# Patient Record
Sex: Female | Born: 2000 | Race: Black or African American | Hispanic: No | Marital: Single | Smoking: Never smoker
Health system: Southern US, Community
[De-identification: ages and names within clinical notes are randomized; demographics above are authoritative.]

## PROBLEM LIST (undated history)

## (undated) DIAGNOSIS — S129XXA Fracture of neck, unspecified, initial encounter: Secondary | ICD-10-CM

## (undated) DIAGNOSIS — S329XXA Fracture of unspecified parts of lumbosacral spine and pelvis, initial encounter for closed fracture: Secondary | ICD-10-CM

## (undated) DIAGNOSIS — S42009A Fracture of unspecified part of unspecified clavicle, initial encounter for closed fracture: Secondary | ICD-10-CM

## (undated) DIAGNOSIS — S0990XA Unspecified injury of head, initial encounter: Secondary | ICD-10-CM

## (undated) DIAGNOSIS — S069XAA Unspecified intracranial injury with loss of consciousness status unknown, initial encounter: Secondary | ICD-10-CM

## (undated) DIAGNOSIS — S7290XA Unspecified fracture of unspecified femur, initial encounter for closed fracture: Secondary | ICD-10-CM

## (undated) DIAGNOSIS — S069X9A Unspecified intracranial injury with loss of consciousness of unspecified duration, initial encounter: Secondary | ICD-10-CM

## (undated) HISTORY — PX: CARDIAC SURGERY: SHX584

## (undated) HISTORY — PX: OTHER SURGICAL HISTORY: SHX169

## (undated) HISTORY — PX: PELVIC FRACTURE SURGERY: SHX119

## (undated) HISTORY — PX: NECK SURGERY: SHX720

---
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
## 2021-06-04 ENCOUNTER — Ambulatory Visit (HOSPITAL_COMMUNITY)
Admission: EM | Admit: 2021-06-04 | Discharge: 2021-06-04 | Disposition: A | Discharge status: Home / Self Care | Payer: Medicaid Other | Attending: Licensed Clinical Social Worker | Admitting: Licensed Clinical Social Worker

## 2021-06-04 DIAGNOSIS — F331 Major depressive disorder, recurrent, moderate: Secondary | ICD-10-CM | POA: Insufficient documentation
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
## 2021-06-04 NOTE — Discharge Instructions (Addendum)
Safety Plan ?Ladan Hromadka will reach out to her current psychiatric provider,call 911 or call mobile crisis, or go to nearest emergency room if condition worsens or if suicidal thoughts become active ?Patients' will follow up with her current provider for outpatient psychiatric services (therapy/medication management).  ?The suicide prevention education provided includes the following: ?Suicide risk factors ?Suicide prevention and interventions ?National Suicide Hotline telephone number ?[facility] assessment telephone number ?[facility] Emergency Assistance 911 ?[place] and/or Residential Mobile Crisis Unit telephone number ?Request made of family/significant other to:   ?Remove weapons (e.g., guns, rifles, knives), all items previously/currently identified as safety concern.   ?Remove drugs/medications (over the counter, prescriptions, illicit drugs), all items previously/currently identified as a safety concern.   ?
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
## 2021-06-04 NOTE — ED Notes (Signed)
Pt discharged with  AVS.  AVS reviewed prior to discharge.  Pt alert, oriented, and ambulatory.  Safety maintained.  °
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
## 2021-08-20 ENCOUNTER — Encounter: Payer: Self-pay | Admitting: Physical Medicine & Rehabilitation

## 2021-11-28 ENCOUNTER — Encounter: Payer: Medicaid Other | Attending: Physical Medicine & Rehabilitation | Admitting: Physical Medicine & Rehabilitation

## 2021-11-28 ENCOUNTER — Encounter: Payer: Self-pay | Admitting: Physical Medicine & Rehabilitation

## 2021-11-28 VITALS — BP 115/67 | HR 86 | Ht 63.0 in | Wt 269.0 lb

## 2021-11-28 DIAGNOSIS — G44321 Chronic post-traumatic headache, intractable: Secondary | ICD-10-CM | POA: Diagnosis not present

## 2021-11-28 DIAGNOSIS — F068 Other specified mental disorders due to known physiological condition: Secondary | ICD-10-CM | POA: Insufficient documentation

## 2021-11-28 DIAGNOSIS — S069X0S Unspecified intracranial injury without loss of consciousness, sequela: Secondary | ICD-10-CM | POA: Diagnosis not present

## 2021-11-28 DIAGNOSIS — R4689 Other symptoms and signs involving appearance and behavior: Secondary | ICD-10-CM | POA: Diagnosis not present

## 2021-11-28 MED ORDER — TOPIRAMATE 25 MG PO TABS: 25.0000 mg | ORAL_TABLET | Freq: Every day | ORAL | 2 refills | Status: DC

## 2021-11-28 NOTE — Progress Notes (Signed)
Subjective:    Patient ID: Candice Robles, female    DOB: March 01, 2000, 21 y.o.   MRN: [id number]  HPI  [name] is here for an iniital evaluation of her TBI. She was injured in 2016 and what appears to be a severe traumatic brain injury ,and was treated at [facility] over the course of 4 months.. She received outpt therapies as well. There was follow up initially by the rehab physcian as well as psychiatry.  Long-term she has been followed by psychiatry including here at [facility] behavioral health.  Appears that she has been on numerous medications for her cognition as well as behavior.  She has been on stimulants in the past for her concentration deficits.  She is currently on guanfacine for attention.  Patient remains reliant on her mother for organization of her day, taking meds etc.  She is able to dress herself but sometimes needs some cueing.  The patient reports that she is agitated easily by every day things. She can get physically abusive and verbally as well. She sometimes is able to walk away from the situation but often things become too inflamed and she is unable to redirect herself or pull herself away.  She is on Vraylar as well as propranolol for mood stabilization currently.  She feels depressed when she "thinks about things'. She feels sad that her friends are going to college. She sees a therapist who helps her deal with the above emotions. She uses music to help calm her through advice of her therapist. She is on prozac 40mg  and propranolol 60 daily  She feels that her appetite has increased. She doesn't have the drive to exercise.   Bedtime is around 7pm and she ends up waking up around 2 since her brother moved out 2 weeks ago. Her sleep is broken after that, usually minimal. Sleep was a problem before her brother moved out. She takes 200mg  trazodone, prazosin, clonidine, guanfacine, hydroxyzine, gabapentin all at bedtime. She used melatonin and nyquil.  She does not  feel that this combination of sleep medications is working for her.  She is having daily which vary in location. They are constant. They seem to be worse in the morning when she gets out of bed. They will also be worse if she misses her mid day nap.      Pain Inventory Average Pain 6 Pain Right Now 3 My pain is intermittent, constant, sharp, burning, dull, stabbing, tingling, and aching  LOCATION OF PAIN  both shoulder, back, headache, right ankle  BOWEL Number of stools per week: 2  BLADDER Normal   Mobility walk without assistance ability to climb steps?  yes do you drive?  no Do you have any goals in this area?  yes  Function disabled: date disabled 2016 I need assistance with the following:  meal prep, household duties, and shopping Do you have any goals in this area?  yes  Neuro/Psych weakness numbness tingling spasms dizziness confusion depression anxiety  Prior Studies Any changes since last visit?  no  Physicians involved in your care Any changes since last visit?  no   Family History  Problem Relation Age of Onset   Diabetes Other    Social History   Socioeconomic History   Marital status: Single    Spouse name: Not on file   Number of children: Not on file   Years of education: Not on file   Highest education level: Not on file  Occupational History  Not on file  Tobacco Use   Smoking status: Never   Smokeless tobacco: Never  Vaping Use   Vaping Use: Never used  Substance and Sexual Activity   Alcohol use: No   Drug use: No   Sexual activity: Not on file  Other Topics Concern   Not on file  Social History Narrative   Not on file   Social Determinants of Health   Financial Resource Strain: Not on file  Food Insecurity: Not on file  Transportation Needs: Not on file  Physical Activity: Not on file  Stress: Not on file  Social Connections: Not on file   Past Surgical History:  Procedure Laterality Date   CARDIAC SURGERY      ear reattachment surgery     facial skin grafts     gastric feeding tube     NECK SURGERY     PELVIC FRACTURE SURGERY     Past Medical History:  Diagnosis Date   Broken neck ([place])    Clavicle fracture    Femur fracture ([place])    Head injury    Pelvic fracture ([facility])    TBI (traumatic brain injury) ([place])    BP 115/67   Pulse 86   Ht 5\' 3"  (1.6 m)   Wt 269 lb (122 kg)   SpO2 96%   BMI 47.65 kg/m   Opioid Risk Score:   Fall Risk Score:  `1  Depression screen [facility] 2/9     06/04/2021    5:59 PM  Depression screen PHQ 2/9  Decreased Interest   Down, Depressed, Hopeless   PHQ - 2 Score   Altered sleeping   Tired, decreased energy   Change in appetite   Feeling bad or failure about yourself    Trouble concentrating   Moving slowly or fidgety/restless   Suicidal thoughts   PHQ-9 Score   Difficult doing work/chores      Information is confidential and restricted. Go to Review Flowsheets to unlock data.    Review of Systems  Constitutional:        Weight gain  Neurological:  Positive for dizziness, weakness and numbness.  Psychiatric/Behavioral:  Positive for confusion.        Depression, anxiety  All other systems reviewed and are negative.      Objective:   Physical Exam  Gen: no distress, normal appearing. obese HEENT: oral mucosa pink and moist, NCAT Cardio: Reg rate Chest: normal effort, normal rate of breathing Abd: soft, non-distended Ext: no edema Psych: pleasant, normal affect.  No signs of aggression or irritability are present today during this visit. Skin: intact.  Some scarring along the right side of the face and scalp. Neuro: Alert and oriented x 3. Normal insight and awareness. Intact Memory. Normal language and speech. Cranial nerve exam unremarkable except for decreased hearing on the right.  She also struggled with vision at distance.  Spelled world forward and backwards. Did some simple algebra. Couldn't do serial 7's. Abstract fair.   Patient recalled 2 out of 3 objects after 5 minutes.  Was able to remember some recent news items as well.  Interestingly she often started a response with "I do not know or I do not remember". Musculoskeletal: subjective left ankle pain. Didn't appear to be overly favoring the limb during gait.        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 Post-traumatic headaches.     Plan: Adjust sleep medication regimen -dc guanfacine, gabapentin, hydroxyzine, clonidine -Continue trazodone and prazosin.  -Add in melatonin 10mg  qhs -Needs to adjust sleep time.  7:00 is too early.  Asked her to target 8:30 PM.  Would like her to decrease her napping during the day as well.  If she needs a short catnap that is okay.  (Less than 30 minutes) otherwise if she goes longer she is affecting her regular sleep at night. 2. Trial of topamax 25mg  qhs, increase to 50mg  in 5 days. 3.  Asked the patient to start taking on more responsibility at home starting with her medications.  It seems as if she has developed a sense of learned helplessness.  I think she is capable of doing much more than she is showing currently.  There may be a maturity factor with this as well. 4.  We will make some other adjustments to her regimen as we go along but will start with her headaches and sleep today and move from there.   Over an hour of face to face patient care time were spent during this visit. All questions were encouraged and answered. Follow up with me in a month or so.

## 2021-11-28 NOTE — Patient Instructions (Addendum)
ALWAYS FEEL FREE TO CALL OUR OFFICE WITH ANY PROBLEMS OR QUESTIONS [phone])  **PLEASE NOTE** ALL MEDICATION REFILL REQUESTS (INCLUDING CONTROLLED SUBSTANCES) NEED TO BE MADE AT LEAST 7 DAYS PRIOR TO REFILL BEING DUE. ANY REFILL REQUESTS INSIDE THAT TIME FRAME MAY RESULT IN DELAYS IN RECEIVING YOUR PRESCRIPTION.   TAKE TOPAMAX 25MG  AT NIGHT FOR 4 DAYS THEN INCREASE TO 50MG     MELATONIN 10MG  AT BEDTIME WITH TRAZODONE, PRAZOSIN, AND TOPAMAX.    STOP GUANFACINE, HYDROXYZINE, GABAPENTIN, CLONIDINE   TRY TO GO TO BED AROUND 8:30.  LIMIT YOUR DAY TIME NAPS ALSO.

## 2022-01-07 ENCOUNTER — Telehealth: Payer: Self-pay

## 2022-01-07 NOTE — Telephone Encounter (Signed)
Horatio Pel Psychiatric Therapist called  to discuss Candice Robles care. She also wanted to know if she will seeing her for the TBI?  Call back phone [phone].

## 2022-02-06 ENCOUNTER — Encounter: Payer: Self-pay | Admitting: Physical Medicine & Rehabilitation

## 2022-02-06 ENCOUNTER — Encounter: Payer: Medicaid Other | Attending: Physical Medicine & Rehabilitation | Admitting: Physical Medicine & Rehabilitation

## 2022-02-06 VITALS — BP 132/84 | HR 85 | Ht 63.0 in | Wt 265.0 lb

## 2022-02-06 DIAGNOSIS — R4689 Other symptoms and signs involving appearance and behavior: Secondary | ICD-10-CM | POA: Diagnosis present

## 2022-02-06 DIAGNOSIS — F4310 Post-traumatic stress disorder, unspecified: Secondary | ICD-10-CM | POA: Diagnosis present

## 2022-02-06 DIAGNOSIS — S069X0S Unspecified intracranial injury without loss of consciousness, sequela: Secondary | ICD-10-CM | POA: Diagnosis present

## 2022-02-06 DIAGNOSIS — F068 Other specified mental disorders due to known physiological condition: Secondary | ICD-10-CM | POA: Diagnosis present

## 2022-02-06 MED ORDER — AZSTARYS 26.1-5.2 MG PO CAPS: 1.0000 | ORAL_CAPSULE | Freq: Every morning | ORAL | 0 refills | Status: DC

## 2022-02-06 MED ORDER — TOPIRAMATE 50 MG PO TABS: 50.0000 mg | ORAL_TABLET | Freq: Two times a day (BID) | ORAL | 2 refills | Status: DC

## 2022-02-06 MED ORDER — PRAZOSIN HCL 5 MG PO CAPS: 5.0000 mg | ORAL_CAPSULE | Freq: Every day | ORAL | 4 refills | Status: DC

## 2022-02-06 NOTE — Progress Notes (Signed)
Subjective:    Patient ID: Candice Robles, female    DOB: January 27, 2000, 22 y.o.   MRN: [id number]  HPI  Candice Robles is here in follow up of her TBI and associated deficits. I last saw her a month ago for her initial evaluation.   She seems to think that she has headaches for about 30 minutes after she eats. Overall she reports improvement. She is going to bed later. Often she is sleeping until 0500 although she will occasionally awaken at 0300.   Mom reports that she still doesn't have much initiation. She remains on azstarys. Mom thinks she needs something more. Candice Robles is not taking responsibility for meds and doesn't initiate much acctivity on her own.    Pain Inventory Average Pain 6 Pain Right Now 7 My pain is aching  LOCATION OF PAIN  head  BOWEL Number of stools per week: 2 Oral laxative use No  Type of laxative . Enema or suppository use No  History of colostomy No  Incontinent No   BLADDER Normal In and out cath, frequency . Able to self cath  . Bladder incontinence No  Frequent urination No  Leakage with coughing No  Difficulty starting stream No  Incomplete bladder emptying No    Mobility how many minutes can you walk? 30 ability to climb steps?  yes do you drive?  no  Function disabled: date disabled 04/25/2014 I need assistance with the following:  toileting, meal prep, and household duties  Neuro/Psych numbness loss of taste or smell  Prior Studies Any changes since last visit?  no  Physicians involved in your care Any changes since last visit?  no   Family History  Problem Relation Age of Onset   Diabetes Other    Social History   Socioeconomic History   Marital status: Single    Spouse name: Not on file   Number of children: Not on file   Years of education: Not on file   Highest education level: Not on file  Occupational History   Not on file  Tobacco Use   Smoking status: Never   Smokeless tobacco: Never  Vaping Use   Vaping  Use: Never used  Substance and Sexual Activity   Alcohol use: No   Drug use: No   Sexual activity: Not on file  Other Topics Concern   Not on file  Social History Narrative   Not on file   Social Determinants of Health   Financial Resource Strain: Not on file  Food Insecurity: Not on file  Transportation Needs: Not on file  Physical Activity: Not on file  Stress: Not on file  Social Connections: Not on file   Past Surgical History:  Procedure Laterality Date   CARDIAC SURGERY     ear reattachment surgery     facial skin grafts     gastric feeding tube     NECK SURGERY     PELVIC FRACTURE SURGERY     Past Medical History:  Diagnosis Date   Broken neck ([place])    Clavicle fracture    Femur fracture ([place])    Head injury    Pelvic fracture ([facility])    TBI (traumatic brain injury) ([place])    BP 132/84   Pulse 85   Ht 5\' 3"  (1.6 m)   Wt 265 lb (120.2 kg)   SpO2 98%   BMI 46.94 kg/m   Opioid Risk Score:   Fall Risk Score:  `1  Depression screen PHQ  2/9     06/04/2021    5:59 PM  Depression screen PHQ 2/9  Decreased Interest   Down, Depressed, Hopeless   PHQ - 2 Score   Altered sleeping   Tired, decreased energy   Change in appetite   Feeling bad or failure about yourself    Trouble concentrating   Moving slowly or fidgety/restless   Suicidal thoughts   PHQ-9 Score   Difficult doing work/chores      Information is confidential and restricted. Go to Review Flowsheets to unlock data.      Review of Systems  Neurological:  Positive for numbness and headaches.  All other systems reviewed and are negative.     Objective:   Physical Exam  General: No acute distress HEENT: NCAT, EOMI, oral membranes moist Cards: reg rate  Chest: normal effort Abdomen: Soft, NT, ND Skin: dry, intact Extremities: no edema Psych: pleasant and appropriate  Skin: intact.  Some scarring along the right side of the face and scalp. Neuro: Alert and oriented x 3. Normal insight  and awareness. Intact Memory. Normal language and speech. Cranial nerve exam unremarkable except for decreased hearing on the right. attention and memory issues still present Musculoskeletal: moves all 4's. Didn't appear to be overly favoring the limb during gait.           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 Post-traumatic headaches.         Plan: Adjusting sleep medication regimen -off guanfacine, gabapentin, hydroxyzine, clonidine -Continue trazodone -increase prazosin to 6mg  for nightmares - melatonin 10mg  qhs -continue later bedtime. 8:30pm --push back to 9 or 9:30pm if possible 2. Increase topamax to 50mg  bid. Headaches are improving 3. We discussed again taking on more responsibility at home starting with her medications.  It seems as if she has developed a sense of learned helplessness.  I think she is capable of doing much more than she is showing currently.  ? maturity factor. 4. Mood seems positive. Continue prozac daily 5. Refilled Azstarys--look at other options for initiation   -calendar, schedule to help with memory, cognition as a whole     Over 30 min of face to face patient care time were spent during this visit. All questions were encouraged and answered. Follow up with me in 2 mos

## 2022-02-06 NOTE — Patient Instructions (Signed)
ALWAYS FEEL FREE TO CALL OUR OFFICE WITH ANY PROBLEMS OR QUESTIONS ([phone])  **PLEASE NOTE** ALL MEDICATION REFILL REQUESTS (INCLUDING CONTROLLED SUBSTANCES) NEED TO BE MADE AT LEAST 7 DAYS PRIOR TO REFILL BEING DUE. ANY REFILL REQUESTS INSIDE THAT TIME FRAME MAY RESULT IN DELAYS IN RECEIVING YOUR PRESCRIPTION.                    

## 2022-02-13 IMAGING — DX DG ANKLE COMPLETE 3+V*L*
3 series · 3 of 3 positions shown · non-contrast
Comparison: None.

CLINICAL DATA: Twisted ankle yesterday.

EXAM:
LEFT ANKLE COMPLETE - 3+ VIEW

[ankle ap]
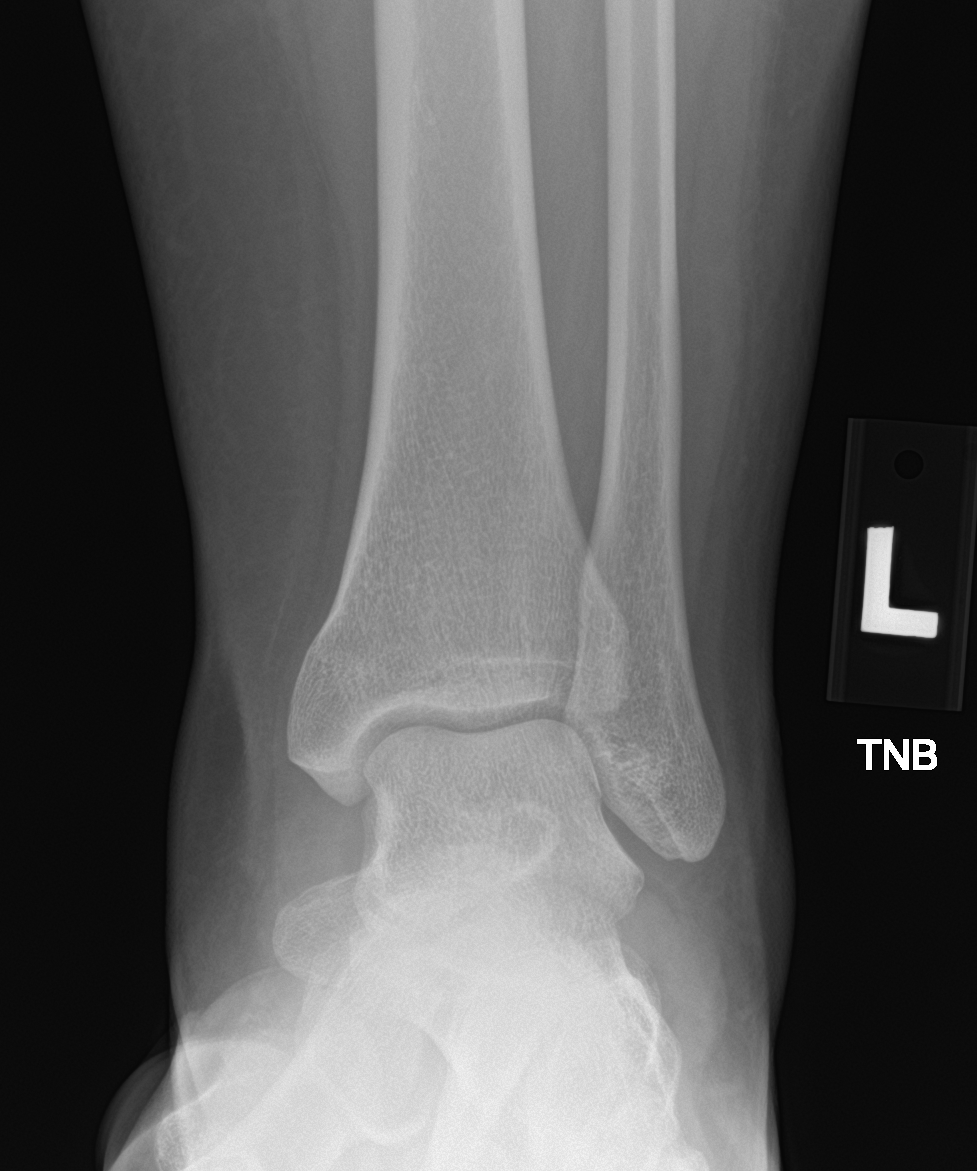

[ankle obl]
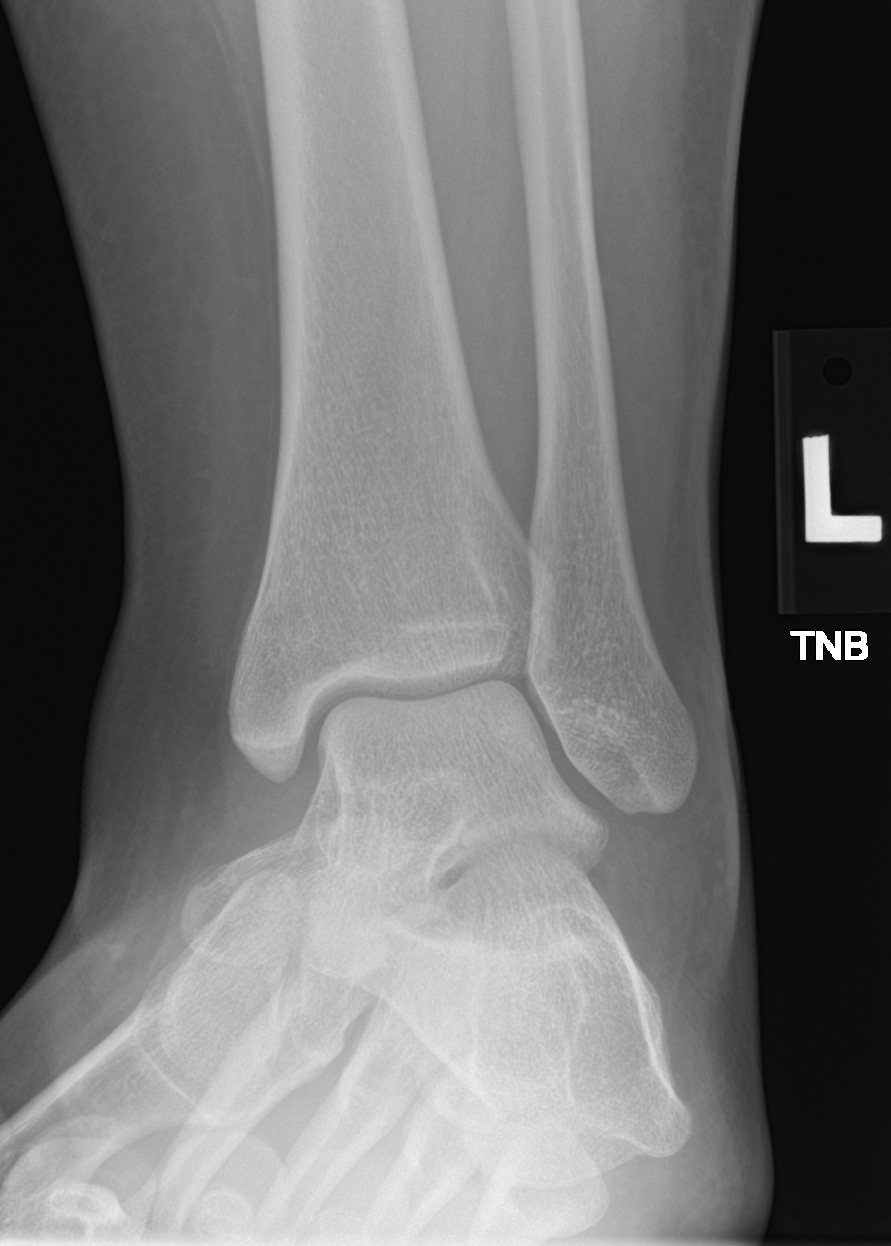

[ankle lat]
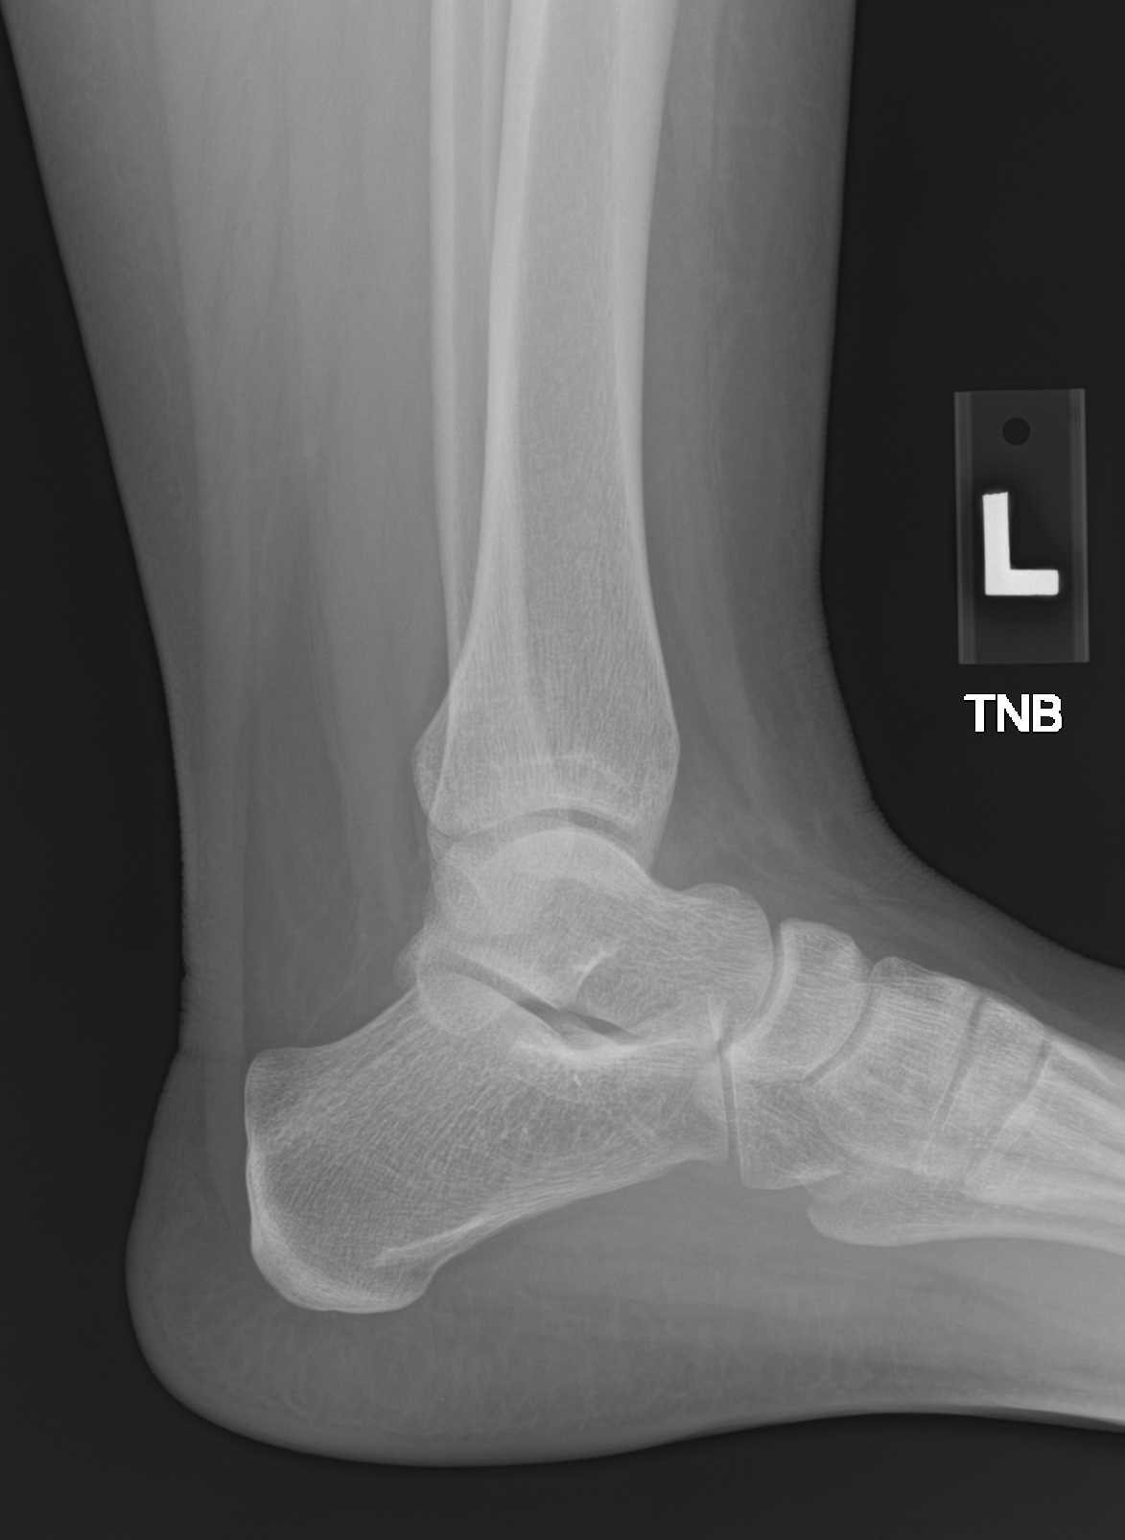

[3 of 3 positions shown; findings below may reference images not displayed]

FINDINGS: There is no evidence of fracture, dislocation, or joint effusion.
There is no evidence of arthropathy or other focal bone abnormality.
Soft tissues are unremarkable.
IMPRESSION: Negative.

## 2022-03-19 ENCOUNTER — Telehealth: Payer: Self-pay | Admitting: *Deleted

## 2022-03-19 DIAGNOSIS — R4689 Other symptoms and signs involving appearance and behavior: Secondary | ICD-10-CM

## 2022-03-19 DIAGNOSIS — F068 Other specified mental disorders due to known physiological condition: Secondary | ICD-10-CM

## 2022-03-19 MED ORDER — AZSTARYS 26.1-5.2 MG PO CAPS: 1.0000 | ORAL_CAPSULE | Freq: Every morning | ORAL | 0 refills | Status: DC

## 2022-03-19 NOTE — Telephone Encounter (Signed)
Patient requesting refill: last ov 02/06/22  AZSTARYS 26.1-5.2 MG CAPS [id number]   Order Details  Dose: 1 capsule Route: Oral Frequency: Every morning  Dispense Quantity: 30 capsule Refills: 0        Sig: Take 1 capsule by mouth every morning.       Start Date: 02/06/22 End Date: --  Written Date: 02/06/22 Expiration Date: 08/05/22  Earliest Fill Date: 02/06/22

## 2022-03-19 NOTE — Telephone Encounter (Signed)
PMP was Reviewed.  Azstarys refilled today.

## 2022-04-03 ENCOUNTER — Encounter: Payer: Self-pay | Admitting: Physical Medicine & Rehabilitation

## 2022-04-03 ENCOUNTER — Encounter: Payer: Medicaid Other | Attending: Physical Medicine & Rehabilitation | Admitting: Physical Medicine & Rehabilitation

## 2022-04-03 VITALS — BP 124/79 | HR 82 | Ht 63.0 in | Wt 263.4 lb

## 2022-04-03 DIAGNOSIS — F068 Other specified mental disorders due to known physiological condition: Secondary | ICD-10-CM | POA: Diagnosis not present

## 2022-04-03 DIAGNOSIS — S069X0S Unspecified intracranial injury without loss of consciousness, sequela: Secondary | ICD-10-CM | POA: Diagnosis not present

## 2022-04-03 DIAGNOSIS — R4689 Other symptoms and signs involving appearance and behavior: Secondary | ICD-10-CM | POA: Insufficient documentation

## 2022-04-03 MED ORDER — AZSTARYS 39.2-7.8 MG PO CAPS: 1.0000 | ORAL_CAPSULE | ORAL | 0 refills | Status: DC

## 2022-04-03 NOTE — Progress Notes (Signed)
Subjective:    Patient ID: Candice Robles, female    DOB: 01-May-2000, 22 y.o.   MRN: [id number]  HPI  Candice Robles is here in follow up of her TBI. She has been walking on the treadmill and going to the gym. She does sit ups and light weight lifting. She has had some abdominal pain perhaps from the sit ups. She is moving bowels regularly. She has occasional neck pain from her prior surgery.   Her headaches have improved with topamax increase. She does use ibuprofen every 3 days or so. She has a mild headache every day.   I asked Neliah to take on some more responsibility at home. She has been more responsible for her meds. Her dad has helped her. Mom says she still doesn't initiate much on her own. She relies still on mom for a lot. Mom works 3rd shift, and she will wake her up for questions frequently while she's asleep.   Mood overall is better. Sleep is more consistent.   Pain Inventory Average Pain 6 Pain Right Now 0 My pain is aching  LOCATION OF PAIN  abdominal  BOWEL Number of stools per week: 6 Oral laxative use No  Type of laxative . Enema or suppository use No  History of colostomy No  Incontinent No   BLADDER Normal In and out cath, frequency . Able to self cath  . Bladder incontinence No  Frequent urination No  Leakage with coughing No  Difficulty starting stream No  Incomplete bladder emptying No    Mobility walk without assistance  Function disabled: date disabled 04/25/2014 I need assistance with the following:  meal prep, household duties, and shopping  Neuro/Psych spasms dizziness depression anxiety loss of taste or smell  Prior Studies Any changes since last visit?  no  Physicians involved in your care Any changes since last visit?  no   Family History  Problem Relation Age of Onset   Diabetes Other    Social History   Socioeconomic History   Marital status: Single    Spouse name: Not on file   Number of children: Not on file    Years of education: Not on file   Highest education level: Not on file  Occupational History   Not on file  Tobacco Use   Smoking status: Never   Smokeless tobacco: Never  Vaping Use   Vaping Use: Never used  Substance and Sexual Activity   Alcohol use: No   Drug use: No   Sexual activity: Not on file  Other Topics Concern   Not on file  Social History Narrative   Not on file   Social Determinants of Health   Financial Resource Strain: Not on file  Food Insecurity: Not on file  Transportation Needs: Not on file  Physical Activity: Not on file  Stress: Not on file  Social Connections: Not on file   Past Surgical History:  Procedure Laterality Date   CARDIAC SURGERY     ear reattachment surgery     facial skin grafts     gastric feeding tube     NECK SURGERY     PELVIC FRACTURE SURGERY     Past Medical History:  Diagnosis Date   Broken neck ([place])    Clavicle fracture    Femur fracture ([place])    Head injury    Pelvic fracture ([facility])    TBI (traumatic brain injury) ([place])    BP 124/79   Pulse 82   Ht  $'5\' 3"'t$  (1.6 m)   Wt 263 lb 6.4 oz (119.5 kg)   SpO2 98%   BMI 46.66 kg/m   Opioid Risk Score:   Fall Risk Score:  `1  Depression screen [facility] 2/9     06/04/2021    5:59 PM  Depression screen PHQ 2/9  Decreased Interest   Down, Depressed, Hopeless   PHQ - 2 Score   Altered sleeping   Tired, decreased energy   Change in appetite   Feeling bad or failure about yourself    Trouble concentrating   Moving slowly or fidgety/restless   Suicidal thoughts   PHQ-9 Score   Difficult doing work/chores      Information is confidential and restricted. Go to Review Flowsheets to unlock data.    Family History  Problem Relation Age of Onset   Diabetes Other    Social History   Socioeconomic History   Marital status: Single    Spouse name: Not on file   Number of children: Not on file   Years of education: Not on file   Highest education level: Not on file   Occupational History   Not on file  Tobacco Use   Smoking status: Never   Smokeless tobacco: Never  Vaping Use   Vaping Use: Never used  Substance and Sexual Activity   Alcohol use: No   Drug use: No   Sexual activity: Not on file  Other Topics Concern   Not on file  Social History Narrative   Not on file   Social Determinants of Health   Financial Resource Strain: Not on file  Food Insecurity: Not on file  Transportation Needs: Not on file  Physical Activity: Not on file  Stress: Not on file  Social Connections: Not on file   Past Surgical History:  Procedure Laterality Date   CARDIAC SURGERY     ear reattachment surgery     facial skin grafts     gastric feeding tube     NECK SURGERY     PELVIC FRACTURE SURGERY     Past Medical History:  Diagnosis Date   Broken neck ([place])    Clavicle fracture    Femur fracture ([place])    Head injury    Pelvic fracture ([facility])    TBI (traumatic brain injury) ([place])    BP 124/79   Pulse 82   Ht '5\' 3"'$  (1.6 m)   Wt 263 lb 6.4 oz (119.5 kg)   SpO2 98%   BMI 46.66 kg/m   Opioid Risk Score:   Fall Risk Score:  `1  Depression screen [facility] 2/9     06/04/2021    5:59 PM  Depression screen PHQ 2/9  Decreased Interest   Down, Depressed, Hopeless   PHQ - 2 Score   Altered sleeping   Tired, decreased energy   Change in appetite   Feeling bad or failure about yourself    Trouble concentrating   Moving slowly or fidgety/restless   Suicidal thoughts   PHQ-9 Score   Difficult doing work/chores      Information is confidential and restricted. Go to Review Flowsheets to unlock data.     Review of Systems  Respiratory:  Positive for shortness of breath.   Gastrointestinal:  Positive for constipation.  Neurological:  Positive for dizziness.  Psychiatric/Behavioral:  Positive for dysphoric mood. The patient is nervous/anxious.   All other systems reviewed and are negative.     Objective:   Physical Exam  General:  No acute  distress HEENT: NCAT, EOMI, oral membranes moist Cards: reg rate  Chest: normal effort Abdomen: Soft, NT, ND Skin: dry, intact Extremities: no edema Psych: pleasant and appropriate  Skin: intact.  Some scarring along the right side of the face and scalp. Neuro: Alert and oriented x 3. Normal insight and awareness. Short term memory and awareness issues. Attention a problem also.  Normal language and speech. Cranial nerve exam unremarkable except for decreased hearing on the right. attention and memory issues still present Musculoskeletal: moves all 4's.           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 Post-traumatic headaches.         Plan: Adjusting sleep medication regimen -off guanfacine, gabapentin, hydroxyzine, clonidine -Continue trazodone -continue prazosin  '6mg'$  for nightmares--she's sleeping better - melatonin '10mg'$  qhs -continue later bedtime.  9 or 9:30pm if possible 2. maintain topamax at '50mg'$  bid. Headaches are improving 3. We discussed AGAIN taking on more responsibility at home starting with her medications.  It seems as if she has developed a sense of learned helplessness.  A routine and schedule are key. She will use her phone alarms more consistently to help with meds 4. Mood seems positive. Continue prozac daily 5. incease Azstarys to 39 mg                       -consider conerta trial   -mom will call back with progress/status   Over 20 min of face to face patient care time were spent during this visit. All questions were encouraged and answered. Follow up with me in 3 mos

## 2022-04-03 NOTE — Patient Instructions (Addendum)
ALWAYS FEEL FREE TO CALL OUR OFFICE WITH ANY PROBLEMS OR QUESTIONS [phone])  **PLEASE NOTE** ALL MEDICATION REFILL REQUESTS (INCLUDING CONTROLLED SUBSTANCES) NEED TO BE MADE AT LEAST 7 DAYS PRIOR TO REFILL BEING DUE. ANY REFILL REQUESTS INSIDE THAT TIME FRAME MAY RESULT IN DELAYS IN RECEIVING YOUR PRESCRIPTION.    USE YOUR PHONE ALARM FOR YOUR MEDS! DON'T TURN OFF THE ALARM UNTIL THE MEDICATION IS IN YOUR BELL!!!!  USE [employer] THROUGHOUT THE HOUSE AS REMINDERS FOR ROUTINE, CHORES, MEDS, ETC. KEEPING A DAILY ROUTINE AND SCHEDULE IS KEY TO BUILDING HER MEMORY.

## 2022-04-05 ENCOUNTER — Telehealth: Payer: Self-pay

## 2022-04-05 NOTE — Telephone Encounter (Signed)
Faxed to medicaid on 04/05/22

## 2022-04-22 NOTE — Telephone Encounter (Signed)
PA has been re-submitted with additional information after speaking with [place] Medicaid Rep call ID# [phone]

## 2022-04-22 NOTE — Telephone Encounter (Signed)
PA for Azstarys has been denied per patient's mother. Patient's mother inquiring plan/ or if medication is going to be switched to something else?

## 2022-04-24 NOTE — Telephone Encounter (Signed)
PA was approved with additional information for Astarys 39.2 mg caps.

## 2022-05-21 ENCOUNTER — Other Ambulatory Visit: Payer: Self-pay | Admitting: *Deleted

## 2022-05-21 DIAGNOSIS — R4689 Other symptoms and signs involving appearance and behavior: Secondary | ICD-10-CM

## 2022-05-21 DIAGNOSIS — F068 Other specified mental disorders due to known physiological condition: Secondary | ICD-10-CM

## 2022-05-21 MED ORDER — TOPIRAMATE 50 MG PO TABS: 50.0000 mg | ORAL_TABLET | Freq: Two times a day (BID) | ORAL | 2 refills | Status: DC

## 2022-05-21 MED ORDER — AZSTARYS 39.2-7.8 MG PO CAPS: 1.0000 | ORAL_CAPSULE | ORAL | 0 refills | Status: DC

## 2022-05-30 ENCOUNTER — Other Ambulatory Visit: Payer: Self-pay | Admitting: Physical Medicine & Rehabilitation

## 2022-05-30 DIAGNOSIS — F4310 Post-traumatic stress disorder, unspecified: Secondary | ICD-10-CM

## 2022-05-30 DIAGNOSIS — R4689 Other symptoms and signs involving appearance and behavior: Secondary | ICD-10-CM

## 2022-05-30 DIAGNOSIS — F068 Other specified mental disorders due to known physiological condition: Secondary | ICD-10-CM

## 2022-06-03 ENCOUNTER — Other Ambulatory Visit: Payer: Self-pay | Admitting: *Deleted

## 2022-06-03 DIAGNOSIS — R4689 Other symptoms and signs involving appearance and behavior: Secondary | ICD-10-CM

## 2022-06-03 DIAGNOSIS — F4310 Post-traumatic stress disorder, unspecified: Secondary | ICD-10-CM

## 2022-06-03 DIAGNOSIS — S069XAS Other symptoms and signs involving appearance and behavior: Secondary | ICD-10-CM

## 2022-06-03 DIAGNOSIS — F068 Other specified mental disorders due to known physiological condition: Secondary | ICD-10-CM

## 2022-06-03 DIAGNOSIS — R4189 Other symptoms and signs involving cognitive functions and awareness: Secondary | ICD-10-CM

## 2022-06-06 NOTE — Telephone Encounter (Signed)
Refill submitted. 

## 2022-06-26 ENCOUNTER — Other Ambulatory Visit: Payer: Self-pay | Admitting: *Deleted

## 2022-06-26 DIAGNOSIS — R4689 Other symptoms and signs involving appearance and behavior: Secondary | ICD-10-CM

## 2022-06-26 DIAGNOSIS — F068 Other specified mental disorders due to known physiological condition: Secondary | ICD-10-CM

## 2022-06-26 MED ORDER — AZSTARYS 39.2-7.8 MG PO CAPS: 1.0000 | ORAL_CAPSULE | ORAL | 0 refills | Status: DC

## 2022-06-26 MED ORDER — VRAYLAR 4.5 MG PO CAPS: ORAL_CAPSULE | ORAL | 5 refills | Status: AC

## 2022-07-03 ENCOUNTER — Encounter: Payer: Self-pay | Admitting: Physical Medicine & Rehabilitation

## 2022-07-03 ENCOUNTER — Encounter: Payer: Medicaid Other | Attending: Physical Medicine & Rehabilitation | Admitting: Physical Medicine & Rehabilitation

## 2022-07-03 VITALS — BP 113/75 | HR 91 | Ht 63.0 in | Wt 258.0 lb

## 2022-07-03 DIAGNOSIS — S069X0S Unspecified intracranial injury without loss of consciousness, sequela: Secondary | ICD-10-CM | POA: Diagnosis present

## 2022-07-03 DIAGNOSIS — R4689 Other symptoms and signs involving appearance and behavior: Secondary | ICD-10-CM | POA: Insufficient documentation

## 2022-07-03 DIAGNOSIS — F068 Other specified mental disorders due to known physiological condition: Secondary | ICD-10-CM | POA: Insufficient documentation

## 2022-07-03 NOTE — Patient Instructions (Addendum)
IMPROVE YOUR SLEEP HYGIENE!  GOING TO BED IN THE EVENING AT A CONSISTENT TIME  TURN OFF NOISES, LIGHTS, TV.   PUT ON RELAXING SOUNDS, MUSIC. READ A BOOK!   LIMIT YOUR NAPS TO 1 HOUR PER DAY----USE AN ALARM  KEEP A ROUTINE AND SCHEDULE

## 2022-07-03 NOTE — Progress Notes (Signed)
Subjective:    Patient ID: Candice Robles, female    DOB: 2000/06/02, 22 y.o.   MRN: [id number]  HPI  Candice Robles is here in follow up of her TBI. She has been volunteering handing out food, picking up trash.   We talked about her sleep patterns which are out of kilter once again.  I asked her how much she is napping during the day.  She told me she is taking about 6 hours of naps per day.  She is up a lot throughout the night.  The matter what she takes from medication standpoint it is not helping.  Regardless of whether she is taking trazodone or melatonin or even Tylenol PM, nothing seems to help.  As result she claims that she feels tired a lot during the day.  I also asked about her medications and it sounds as if she still not taking regular responsibility for them.  Mom says that when she is with her dad she will often come home with most of her medications on taken.  Otherwise she does not have a lot of complaints.  She does note some occasional left ankle swelling which can be tender.  She is wearing crocs today but states that she does have some tennis shoes that she wears.  Mom notes that her gait can be a bit disjointed.   Pain Inventory Average Pain 0 Pain Right Now 8 My pain is intermittent  LOCATION OF PAIN  stomach, legs, arms & shoulder  BOWEL Number of stools per week: 7 Oral laxative use No    BLADDER Normal    Mobility walk without assistance ability to climb steps?  yes do you drive?  no Do you have any goals in this area?  yes  Function disabled: date disabled 04/2014 I need assistance with the following:  meal prep, household duties, and shopping Do you have any goals in this area?  yes  Neuro/Psych bowel control problems weakness numbness spasms dizziness confusion depression anxiety loss of taste or smell  Prior Studies Any changes since last visit?  no  Physicians involved in your care Any changes since last visit?  no   Family  History  Problem Relation Age of Onset   Diabetes Other    Social History   Socioeconomic History   Marital status: Single    Spouse name: Not on file   Number of children: Not on file   Years of education: Not on file   Highest education level: Not on file  Occupational History   Not on file  Tobacco Use   Smoking status: Never   Smokeless tobacco: Never  Vaping Use   Vaping Use: Never used  Substance and Sexual Activity   Alcohol use: No   Drug use: No   Sexual activity: Not on file  Other Topics Concern   Not on file  Social History Narrative   Not on file   Social Determinants of Health   Financial Resource Strain: Not on file  Food Insecurity: Not on file  Transportation Needs: Not on file  Physical Activity: Not on file  Stress: Not on file  Social Connections: Not on file   Past Surgical History:  Procedure Laterality Date   CARDIAC SURGERY     ear reattachment surgery     facial skin grafts     gastric feeding tube     NECK SURGERY     PELVIC FRACTURE SURGERY     Past Medical History:  Diagnosis Date   Broken neck (HCC)    Clavicle fracture    Femur fracture (HCC)    Head injury    Pelvic fracture (HCC)    TBI (traumatic brain injury) (HCC)    BP 113/75   Pulse 91   Ht 5\' 3"  (1.6 m)   Wt 258 lb (117 kg)   SpO2 98%   BMI 45.70 kg/m   Opioid Risk Score:   Fall Risk Score:  `1  Depression screen [facility] 2/9     07/03/2022    9:23 AM 06/04/2021    5:59 PM  Depression screen PHQ 2/9  Decreased Interest 1   Down, Depressed, Hopeless 1   PHQ - 2 Score 2   Altered sleeping    Tired, decreased energy    Change in appetite    Feeling bad or failure about yourself     Trouble concentrating    Moving slowly or fidgety/restless    Suicidal thoughts    PHQ-9 Score    Difficult doing work/chores       Information is confidential and restricted. Go to Review Flowsheets to unlock data.    Review of Systems  Constitutional:        Loss of  taste  Cardiovascular:  Negative for leg swelling.       Swelling in both ankles  Gastrointestinal:  Positive for diarrhea.  Musculoskeletal:        Pain in both shoulder, pain in both legs & ankles  Neurological:  Positive for dizziness and headaches.       Spasms  Psychiatric/Behavioral:  Positive for confusion.        Depression, anxiety  All other systems reviewed and are negative.      Objective:   Physical Exam  General: No acute distress HEENT: NCAT, EOMI, oral membranes moist Cards: reg rate  Chest: normal effort Abdomen: Soft, NT, ND Skin: dry, intact Extremities: tr LE edema Psych: pleasant and appropriate  Skin: intact.  Some scarring along the right side of the face and scalp. Neuro: Alert and oriented x 3. Normal insight and awareness. Short term memory and awareness issues. Attention a problem also.  Normal language and speech. Cranial nerve exam unremarkable except for decreased hearing on the right. attention and memory isses ongoing Musculoskeletal: steps down into left leg. Still with decreased coordination.           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 Post-traumatic headaches.         Plan: Adjusting sleep medication regimen -off guanfacine, gabapentin, hydroxyzine, clonidine -Continue trazodone -continue prazosin  6mg  for nightmares--  - melatonin 10mg  qhs -needs to establish consistent nightly sleep! Without this her cognitive issues/faitgue will continue to be issues. None of the above meds will help if she's not restoring her normal cycle. -may take an hour nap during the day. Can use an alarm to wake her.  2. maintain topamax at 50mg  bid. Headaches are improving 3. Discussed being reponsible for meds 4. Mood seems positive. Continue prozac daily. I'm happy that she's doing some volunteer type activities.   5. Continue Azstarys to 39 mg                       -consider conerta trial              -stiimulants won't help  if she's napping throughout the day and awake a tnights   15 min of face to face patient care time were spent during this visit. All questions were encouraged and answered. Follow up with me in 2 mos

## 2022-07-16 ENCOUNTER — Other Ambulatory Visit: Payer: Self-pay | Admitting: Physical Medicine and Rehabilitation

## 2022-07-16 ENCOUNTER — Telehealth: Payer: Self-pay

## 2022-07-16 MED ORDER — PROPRANOLOL HCL 60 MG PO TABS: 60.0000 mg | ORAL_TABLET | Freq: Every day | ORAL | 3 refills | Status: AC

## 2022-07-16 NOTE — Telephone Encounter (Signed)
Patient's mother notified.

## 2022-07-16 NOTE — Telephone Encounter (Signed)
Pt mother called requesting a refill on Propranolol. I did not see it in Dr. Riley Kill last note. Can you address this in his absence? [facility]

## 2022-07-29 ENCOUNTER — Telehealth: Payer: Self-pay | Admitting: Physical Medicine & Rehabilitation

## 2022-07-29 DIAGNOSIS — R4689 Other symptoms and signs involving appearance and behavior: Secondary | ICD-10-CM

## 2022-07-29 DIAGNOSIS — F068 Other specified mental disorders due to known physiological condition: Secondary | ICD-10-CM

## 2022-07-29 MED ORDER — AZSTARYS 26.1-5.2 MG PO CAPS
1.0000 | ORAL_CAPSULE | Freq: Every morning | ORAL | 0 refills | Status: DC
Start: 2022-07-29 — End: 2022-07-30

## 2022-07-29 MED ORDER — AZSTARYS 26.1-5.2 MG PO CAPS
1.0000 | ORAL_CAPSULE | Freq: Every day | ORAL | 0 refills | Status: DC
Start: 2022-07-29 — End: 2022-07-30

## 2022-07-29 NOTE — Telephone Encounter (Signed)
LVM rx sent 

## 2022-07-29 NOTE — Telephone Encounter (Signed)
Rx's written for July and August. thanks

## 2022-07-29 NOTE — Telephone Encounter (Signed)
Mother states wrong dose was sent in. Patient is on Azstarys 39.2 once in the am.

## 2022-07-29 NOTE — Telephone Encounter (Addendum)
Patient's mother requesting refill on Azstarys at [address]. Mom's callback [phone].

## 2022-07-29 NOTE — Telephone Encounter (Signed)
Requesting prescription refill, I was not able to decipher which medication she is needing on voicemail

## 2022-07-30 MED ORDER — SERDEXMETHYLPHEN-DEXMETHYLPHEN 39.2-7.8 MG PO CAPS: 1.0000 | ORAL_CAPSULE | Freq: Every day | ORAL | 0 refills | Status: DC

## 2022-07-30 MED ORDER — AZSTARYS 39.2-7.8 MG PO CAPS
1.0000 | ORAL_CAPSULE | Freq: Every day | ORAL | 0 refills | Status: DC
Start: 2022-07-30 — End: 2022-10-02

## 2022-07-30 NOTE — Telephone Encounter (Signed)
Sorry about that. The old rx'es were still in there.  All set now.

## 2022-07-30 NOTE — Telephone Encounter (Signed)
LVM issue corrected.

## 2022-09-09 ENCOUNTER — Other Ambulatory Visit: Payer: Self-pay | Admitting: Physical Medicine & Rehabilitation

## 2022-09-09 DIAGNOSIS — F068 Other specified mental disorders due to known physiological condition: Secondary | ICD-10-CM

## 2022-09-09 DIAGNOSIS — S069X0S Unspecified intracranial injury without loss of consciousness, sequela: Secondary | ICD-10-CM

## 2022-10-02 ENCOUNTER — Encounter: Payer: Medicaid Other | Admitting: Physical Medicine & Rehabilitation

## 2022-10-02 ENCOUNTER — Telehealth: Payer: Self-pay | Admitting: Physical Medicine & Rehabilitation

## 2022-10-02 DIAGNOSIS — R4689 Other symptoms and signs involving appearance and behavior: Secondary | ICD-10-CM

## 2022-10-02 DIAGNOSIS — S069X0S Unspecified intracranial injury without loss of consciousness, sequela: Secondary | ICD-10-CM

## 2022-10-02 MED ORDER — SERDEXMETHYLPHEN-DEXMETHYLPHEN 39.2-7.8 MG PO CAPS
1.0000 | ORAL_CAPSULE | Freq: Every day | ORAL | 0 refills | Status: AC
Start: 2022-10-02 — End: ?

## 2022-10-02 MED ORDER — AZSTARYS 39.2-7.8 MG PO CAPS
1.0000 | ORAL_CAPSULE | Freq: Every day | ORAL | 0 refills | Status: AC
Start: 2022-10-02 — End: ?

## 2022-10-02 NOTE — Telephone Encounter (Signed)
Please send in medication refills. Patient arrived too late to be seen today.

## 2022-10-02 NOTE — Telephone Encounter (Signed)
Rxs sent

## 2022-10-30 ENCOUNTER — Encounter: Payer: Medicaid Other | Attending: Physical Medicine & Rehabilitation | Admitting: Physical Medicine & Rehabilitation

## 2022-11-16 ENCOUNTER — Encounter (HOSPITAL_COMMUNITY): Payer: Self-pay

## 2022-11-16 ENCOUNTER — Emergency Department (HOSPITAL_COMMUNITY)
Admission: EM | Admit: 2022-11-16 | Discharge: 2022-11-17 | Disposition: A | Discharge status: Home / Self Care | Payer: Medicaid Other | Attending: Student | Admitting: Student

## 2022-11-16 DIAGNOSIS — F332 Major depressive disorder, recurrent severe without psychotic features: Secondary | ICD-10-CM | POA: Diagnosis present

## 2022-11-16 DIAGNOSIS — R4689 Other symptoms and signs involving appearance and behavior: Secondary | ICD-10-CM | POA: Diagnosis present

## 2022-11-16 DIAGNOSIS — R4589 Other symptoms and signs involving emotional state: Secondary | ICD-10-CM

## 2022-11-16 NOTE — ED Triage Notes (Signed)
Pt was BIB Candice Robles d/t IVC.  Robles states mom was taking pt to the [facility] for her aggressive behavior when she attempted to jump out of the car.   Pt states she did not hurt herself.  Mom said feet were dragging but no evidence.  Pt stated in triage she "does not have an intellectual disability that she has had a TBI".

## 2022-11-16 NOTE — ED Provider Notes (Signed)
[place] EMERGENCY DEPARTMENT AT [facility] Provider Note   CSN: [id number] Arrival date & time: 11/16/22  2310     History {Add pertinent medical, surgical, social history, OB history to HPI:1} Chief Complaint  Patient presents with   IVC    Candice Robles is a 22 y.o. female.  HPI     Home Medications Prior to Admission medications   Medication Sig Start Date End Date Taking? Authorizing Provider  chlorhexidine (PERIDEX) 0.12 % solution SMARTSIG:By Mouth 11/21/21   [provider]  Cholecalciferol 50 MCG (2000 UT) CAPS take 1 capsule by oral route daily for 30 days    [provider]  diphenhydrAMINE (BENADRYL CHILDRENS ALLERGY) 12.5 MG/5ML liquid Take by mouth. 03/24/15   [provider]  doxycycline (MONODOX) 100 MG capsule Take 100 mg by mouth 2 (two) times daily. 08/07/22   [provider]  FLUoxetine (PROZAC) 40 MG capsule Take 40 mg by mouth every morning. 10/23/21   [provider]  gabapentin (NEURONTIN) 100 MG capsule TAKE 2 CAPSULES BY MOUTH EVERY MORNING AND 4 CAPSULES BY MOUTH AT NIGHT 07/27/19   [provider]  hydrOXYzine (VISTARIL) 25 MG capsule Take by mouth. 02/15/19   [provider]  ibuprofen (ADVIL) 600 MG tablet Take 1 tablet (600 mg total) by mouth every 6 (six) hours as needed. Patient taking differently: Take 600 mg by mouth every 6 (six) hours as needed for moderate pain. 07/12/19   [name], [name], PA-C  loratadine (CLARITIN) 10 MG tablet Take by mouth. 12/05/21 12/05/22  [provider]  prazosin (MINIPRESS) 2 MG capsule TAKE 1 CAPSULE BY MOUTH EVERY NIGHT AT BEDTIME FOR NIGHTMARES 10/14/19   [provider]  prazosin (MINIPRESS) 5 MG capsule Take 5 mg by mouth at bedtime. 08/24/22   [provider]  prochlorperazine (COMPAZINE) 10 MG tablet Take by mouth. 05/17/19   [provider]  propranolol (INDERAL) 60 MG tablet Take 1 tablet (60 mg total)  by mouth daily. 07/16/22   Raulkar, Drema Pry, MD  Serdexmethylphen-Dexmethylphen (AZSTARYS) 39.2-7.8 MG CAPS Take 1 capsule by mouth daily with breakfast. 10/02/22   Ranelle Oyster, MD  Serdexmethylphen-Dexmethylphen 39.2-7.8 MG CAPS Take 1 capsule by mouth daily with breakfast. 10/02/22   Ranelle Oyster, MD  topiramate (TOPAMAX) 50 MG tablet TAKE 1 TABLET(50 MG) BY MOUTH TWICE DAILY 09/10/22   Ranelle Oyster, MD  traZODone (DESYREL) 100 MG tablet Take 100-200 mg by mouth at bedtime.    [provider]  VRAYLAR 4.5 MG CAPS SMARTSIG:1 Capsule(s) By Mouth Every Evening 06/26/22   Ranelle Oyster, MD      Allergies    Other    Review of Systems   Review of Systems  Physical Exam Updated Vital Signs BP 124/86   Pulse 87   Temp 98.4 F (36.9 C) (Oral)   Resp 18   SpO2 99%  Physical Exam  ED Results / Procedures / Treatments   Labs (all labs ordered are listed, but only abnormal results are displayed) Labs Reviewed  COMPREHENSIVE METABOLIC PANEL  ETHANOL  SALICYLATE LEVEL  ACETAMINOPHEN LEVEL  CBC  RAPID URINE DRUG SCREEN, HOSP PERFORMED  HCG, SERUM, QUALITATIVE    EKG None  Radiology No results found.  Procedures Procedures  {Document cardiac monitor, telemetry assessment procedure when appropriate:1}  Medications Ordered in ED Medications - No data to display  ED Course/ Medical Decision Making/ A&P   {   Click here for ABCD2,  HEART and other calculatorsREFRESH Note before signing :1}                              Medical Decision Making Amount and/or Complexity of Data Reviewed Labs: ordered.   ***  {Document critical care time when appropriate:1} {Document review of labs and clinical decision tools ie heart score, Chads2Vasc2 etc:1}  {Document your independent review of radiology images, and any outside records:1} {Document your discussion with family members, caretakers, and with consultants:1} {Document social determinants of health  affecting pt's care:1} {Document your decision making why or why not admission, treatments were needed:1} Final Clinical Impression(s) / ED Diagnoses Final diagnoses:  None    Rx / DC Orders ED Discharge Orders     None

## 2022-11-17 DIAGNOSIS — F332 Major depressive disorder, recurrent severe without psychotic features: Secondary | ICD-10-CM | POA: Diagnosis present

## 2022-11-17 LAB — COMPREHENSIVE METABOLIC PANEL
ALT: 18 U/L (ref 0–44)
AST: 15 U/L (ref 15–41)
Albumin: 3.6 g/dL (ref 3.5–5.0)
Alkaline Phosphatase: 68 U/L (ref 38–126)
Anion gap: 6 (ref 5–15)
BUN: 8 mg/dL (ref 6–20)
CO2: 24 mmol/L (ref 22–32)
Calcium: 9 mg/dL (ref 8.9–10.3)
Chloride: 107 mmol/L (ref 98–111)
Creatinine, Ser: 0.65 mg/dL (ref 0.44–1.00)
GFR, Estimated: >60 mL/min (ref >60)
Glucose, Bld: 102 mg/dL — ABNORMAL HIGH (ref 70–99)
Potassium: 3.7 mmol/L (ref 3.5–5.1)
Sodium: 137 mmol/L (ref 135–145)
Total Bilirubin: 0.3 mg/dL (ref 0.3–1.2)
Total Protein: 7.3 g/dL (ref 6.5–8.1)

## 2022-11-17 LAB — RAPID URINE DRUG SCREEN, HOSP PERFORMED
Amphetamines: NOT DETECTED
Barbiturates: NOT DETECTED
Benzodiazepines: NOT DETECTED
Cocaine: NOT DETECTED
Opiates: NOT DETECTED
Tetrahydrocannabinol: NOT DETECTED

## 2022-11-17 LAB — ETHANOL: Alcohol, Ethyl (B): <10 mg/dL (ref <10)

## 2022-11-17 LAB — CBC
HCT: 36.6 % (ref 36.0–46.0)
Hemoglobin: 12 g/dL (ref 12.0–15.0)
MCH: 30.3 pg (ref 26.0–34.0)
MCHC: 32.8 g/dL (ref 30.0–36.0)
MCV: 92.4 fL (ref 80.0–100.0)
Platelets: 247 10*3/uL (ref 150–400)
RBC: 3.96 MIL/uL (ref 3.87–5.11)
RDW: 13.4 % (ref 11.5–15.5)
WBC: 6 10*3/uL (ref 4.0–10.5)
nRBC: 0 % (ref 0.0–0.2)

## 2022-11-17 LAB — ACETAMINOPHEN LEVEL: Acetaminophen (Tylenol), Serum: <10 ug/mL — ABNORMAL LOW (ref 10–30)

## 2022-11-17 LAB — HCG, SERUM, QUALITATIVE: Preg, Serum: NEGATIVE

## 2022-11-17 LAB — SALICYLATE LEVEL: Salicylate Lvl: <7 mg/dL — ABNORMAL LOW (ref 7.0–30.0)
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
## 2022-11-17 NOTE — ED Provider Notes (Addendum)
Emergency Medicine Observation Re-evaluation Note  [facility] is a 22 y.o. female, seen on rounds today.  Pt initially presented to the ED for complaints of IVC Currently, the patient is sitting in the room.  Physical Exam  BP 124/86   Pulse 87   Temp 98.4 F (36.9 C) (Oral)   Resp 18   SpO2 99%  Physical Exam General: resting comfortably, NAD Lungs: normal WOB Psych: currently calm and resting  ED Course / MDM  EKG:   I have reviewed the labs performed to date as well as medications administered while in observation.  Recent changes in the last 24 hours include psychiatric evaluation and recommendation for inpatient psychiatric care.  Plan  Current plan is for inpatient psychiatric care and placement.    Rozelle Logan, [place] 11/17/22 [id number]   Patient reevaluated by psychiatry and psychiatrically cleared.  They are resending the IVC.  This order has been discontinued and she will be discharged at the request of the psychiatric team.     Rozelle Logan, DO 11/17/22 1248

## 2022-11-17 NOTE — ED Notes (Signed)
Pt has been dressed out and belongings have been placed in two bags with the pts label. They are located in triage underneath the ice machine. Security has wanded the patient.
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
## 2022-11-17 NOTE — Progress Notes (Signed)
Patient has been denied by [facility] due to no appropriate beds available. Patient meets BH inpatient criteria per Roselyn Bering, NP. Patient has been faxed out to the following facilities:   [facility]  [address] [place] [place] [address] [phone]  [facility] [address]  [place] [address] [phone]  [facility]  [address] [place] [address] [phone]  [facility]  [address] [place] [address] [phone]  [facility]  [address], [place] [address] [phone]  [facility]  [facility], [place] [phone]  [facility]  [address] [place] [address] [phone]  [facility]  [address], [place] [address] [phone]  [facility]  [address], [place] [address] [phone]  [facility]  [address] [place] [address] [phone]  [facility]  [address] [place] [address] [phone]  [facility]  [address] [place] [address] [phone]  [facility]  [address] [place], [place] [address] [phone]  [facility]  [address] [place] [address] [phone]  [facility]  [address] [place] [address] [phone]  [facility]  [address] [place] [address] [phone]  [facility]  [address] [place] [address] [phone]  [facility]  [address] [place] [address] [phone]    Damita Dunnings, MSW, LCSW-A  9:47 AM 11/17/2022

## 2023-01-03 ENCOUNTER — Emergency Department (HOSPITAL_COMMUNITY): Payer: Medicaid Other

## 2023-01-03 ENCOUNTER — Other Ambulatory Visit: Payer: Self-pay

## 2023-01-03 ENCOUNTER — Emergency Department (HOSPITAL_COMMUNITY)
Admission: EM | Admit: 2023-01-03 | Discharge: 2023-01-03 | Disposition: A | Discharge status: Home / Self Care | Payer: Medicaid Other | Attending: Emergency Medicine | Admitting: Emergency Medicine

## 2023-01-03 DIAGNOSIS — R55 Syncope and collapse: Secondary | ICD-10-CM | POA: Insufficient documentation

## 2023-01-03 DIAGNOSIS — R110 Nausea: Secondary | ICD-10-CM | POA: Diagnosis not present

## 2023-01-03 LAB — COMPREHENSIVE METABOLIC PANEL
ALT: 17 U/L (ref 0–44)
AST: 15 U/L (ref 15–41)
Albumin: 3.7 g/dL (ref 3.5–5.0)
Alkaline Phosphatase: 71 U/L (ref 38–126)
Anion gap: 6 (ref 5–15)
BUN: 14 mg/dL (ref 6–20)
CO2: 25 mmol/L (ref 22–32)
Calcium: 9.3 mg/dL (ref 8.9–10.3)
Chloride: 105 mmol/L (ref 98–111)
Creatinine, Ser: 0.77 mg/dL (ref 0.44–1.00)
GFR, Estimated: >60 mL/min (ref >60)
Glucose, Bld: 95 mg/dL (ref 70–99)
Potassium: 3.9 mmol/L (ref 3.5–5.1)
Sodium: 136 mmol/L (ref 135–145)
Total Bilirubin: 0.3 mg/dL (ref <1.2)
Total Protein: 7.9 g/dL (ref 6.5–8.1)

## 2023-01-03 LAB — CBC WITH DIFFERENTIAL/PLATELET
Abs Immature Granulocytes: 0.01 10*3/uL (ref 0.00–0.07)
Basophils Absolute: 0 10*3/uL (ref 0.0–0.1)
Basophils Relative: 1 %
Eosinophils Absolute: 0.1 10*3/uL (ref 0.0–0.5)
Eosinophils Relative: 2 %
HCT: 39.5 % (ref 36.0–46.0)
Hemoglobin: 12.7 g/dL (ref 12.0–15.0)
Immature Granulocytes: 0 %
Lymphocytes Relative: 41 %
Lymphs Abs: 2.4 10*3/uL (ref 0.7–4.0)
MCH: 29.5 pg (ref 26.0–34.0)
MCHC: 32.2 g/dL (ref 30.0–36.0)
MCV: 91.9 fL (ref 80.0–100.0)
Monocytes Absolute: 0.5 10*3/uL (ref 0.1–1.0)
Monocytes Relative: 9 %
Neutro Abs: 2.8 10*3/uL (ref 1.7–7.7)
Neutrophils Relative %: 47 %
Platelets: 293 10*3/uL (ref 150–400)
RBC: 4.3 MIL/uL (ref 3.87–5.11)
RDW: 12.8 % (ref 11.5–15.5)
WBC: 5.8 10*3/uL (ref 4.0–10.5)
nRBC: 0 % (ref 0.0–0.2)

## 2023-01-03 LAB — HCG, SERUM, QUALITATIVE: Preg, Serum: NEGATIVE

## 2023-01-03 LAB — LIPASE, BLOOD: Lipase: 34 U/L (ref 11–51)

## 2023-01-03 MED ORDER — ONDANSETRON 4 MG PO TBDP
4.0000 mg | ORAL_TABLET | Freq: Once | ORAL | Status: AC
Start: 2023-01-03 — End: 2023-01-03
  Administered 2023-01-03: 4 mg via ORAL
  Filled 2023-01-03: qty 1

## 2023-01-03 MED ORDER — ONDANSETRON HCL 4 MG PO TABS: 4.0000 mg | ORAL_TABLET | Freq: Four times a day (QID) | ORAL | 0 refills | Status: AC
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
## 2023-01-03 NOTE — Discharge Instructions (Signed)
The blood test look well today and the CAT scan of the head was normal without sign of new injury.  You may be coming down with a bug and you were given a prescription for nausea medicine to use if you need it.  If your symptoms worsen or you start getting severe pain and cannot hold anything down return to the emergency room.

## 2023-01-03 NOTE — ED Triage Notes (Signed)
Pt BIB EMS coming from work. Pt states she passed out at work and hit her head. Per mother they called her and said that she was vomiting and had passed out.  BP 110/80, HR 85, Spo2 98%, CBG 117

## 2023-04-02 ENCOUNTER — Encounter (HOSPITAL_BASED_OUTPATIENT_CLINIC_OR_DEPARTMENT_OTHER): Payer: Self-pay

## 2023-04-02 ENCOUNTER — Emergency Department (HOSPITAL_BASED_OUTPATIENT_CLINIC_OR_DEPARTMENT_OTHER)
Admission: EM | Admit: 2023-04-02 | Discharge: 2023-04-02 | Disposition: A | Discharge status: Home / Self Care | Attending: Emergency Medicine | Admitting: Emergency Medicine

## 2023-04-02 ENCOUNTER — Other Ambulatory Visit: Payer: Self-pay

## 2023-04-02 ENCOUNTER — Emergency Department (HOSPITAL_BASED_OUTPATIENT_CLINIC_OR_DEPARTMENT_OTHER)

## 2023-04-02 DIAGNOSIS — Z79899 Other long term (current) drug therapy: Secondary | ICD-10-CM | POA: Insufficient documentation

## 2023-04-02 DIAGNOSIS — R042 Hemoptysis: Secondary | ICD-10-CM | POA: Diagnosis not present

## 2023-04-02 DIAGNOSIS — K920 Hematemesis: Secondary | ICD-10-CM | POA: Diagnosis present

## 2023-04-02 MED ORDER — IBUPROFEN 400 MG PO TABS
600.0000 mg | ORAL_TABLET | Freq: Once | ORAL | Status: AC
  Administered 2023-04-02: 600 mg via ORAL
  Filled 2023-04-02: qty 1

## 2023-04-02 NOTE — Discharge Instructions (Signed)
 Take ibuprofen 600 mg every 6 hours as needed for pain.  Return to the emergency department if your symptoms significantly worsen or change.

## 2023-04-02 NOTE — ED Triage Notes (Signed)
 Says she woke up ~30 minutes ago and was spitting up blood.   Has a video with small amounts of red tinged fluid in sink.

## 2023-04-02 NOTE — ED Provider Notes (Signed)
 [place] [facility] Provider Note   CSN: [id number] Arrival date & time: 04/02/23  0442     History  Chief Complaint  Patient presents with   Hematemesis    Candice Robles is a 23 y.o. female.  Patient is a 23 year old female with history of triple attic brain injury, depression.  Patient presenting today for evaluation of "spitting up blood".  Patient woke from sleep coughing, then spit out blood on several occasions.  She took a video of this which shows a pinkish sputum.  She denies any diarrhea.  No fevers or chills.  She reports some cough and chest tightness.  The history is provided by the patient.       Home Medications Prior to Admission medications   Medication Sig Start Date End Date Taking? Authorizing Provider  chlorhexidine (PERIDEX) 0.12 % solution SMARTSIG:By Mouth 11/21/21   [provider]  Cholecalciferol 50 MCG (2000 UT) CAPS take 1 capsule by oral route daily for 30 days    [provider]  diphenhydrAMINE (BENADRYL CHILDRENS ALLERGY) 12.5 MG/5ML liquid Take by mouth. 03/24/15   [provider]  doxycycline (MONODOX) 100 MG capsule Take 100 mg by mouth 2 (two) times daily. 08/07/22   [provider]  FLUoxetine (PROZAC) 40 MG capsule Take 40 mg by mouth every morning. 10/23/21   [provider]  gabapentin (NEURONTIN) 100 MG capsule TAKE 2 CAPSULES BY MOUTH EVERY MORNING AND 4 CAPSULES BY MOUTH AT NIGHT 07/27/19   [provider]  hydrOXYzine (VISTARIL) 25 MG capsule Take by mouth. 02/15/19   [provider]  ibuprofen (ADVIL) 600 MG tablet Take 1 tablet (600 mg total) by mouth every 6 (six) hours as needed. Patient taking differently: Take 600 mg by mouth every 6 (six) hours as needed for moderate pain. 07/12/19   [name], [name], PA-C  ondansetron (ZOFRAN) 4 MG tablet Take 1 tablet (4 mg total) by mouth every 6 (six) hours. 01/03/23   Gwyneth Sprout, MD   prazosin (MINIPRESS) 2 MG capsule TAKE 1 CAPSULE BY MOUTH EVERY NIGHT AT BEDTIME FOR NIGHTMARES 10/14/19   [provider]  prazosin (MINIPRESS) 5 MG capsule Take 5 mg by mouth at bedtime. 08/24/22   [provider]  prochlorperazine (COMPAZINE) 10 MG tablet Take by mouth. 05/17/19   [provider]  propranolol (INDERAL) 60 MG tablet Take 1 tablet (60 mg total) by mouth daily. 07/16/22   Raulkar, Drema Pry, MD  Serdexmethylphen-Dexmethylphen (AZSTARYS) 39.2-7.8 MG CAPS Take 1 capsule by mouth daily with breakfast. 10/02/22   Ranelle Oyster, MD  Serdexmethylphen-Dexmethylphen 39.2-7.8 MG CAPS Take 1 capsule by mouth daily with breakfast. 10/02/22   Ranelle Oyster, MD  topiramate (TOPAMAX) 50 MG tablet TAKE 1 TABLET(50 MG) BY MOUTH TWICE DAILY 09/10/22   Ranelle Oyster, MD  traZODone (DESYREL) 100 MG tablet Take 100-200 mg by mouth at bedtime.    [provider]  VRAYLAR 4.5 MG CAPS SMARTSIG:1 Capsule(s) By Mouth Every Evening 06/26/22   Ranelle Oyster, MD      Allergies    Other    Review of Systems   Review of Systems  All other systems reviewed and are negative.   Physical Exam Updated Vital Signs BP (!) 152/100   Pulse (!) 113   Temp 97.7 F (36.5 C)   Resp 20   Ht 5\' 3"  (1.6 m)   Wt 117 kg   SpO2 97%   BMI  45.70 kg/m  Physical Exam Vitals and nursing note reviewed.  Constitutional:      General: She is not in acute distress.    Appearance: She is well-developed. She is not diaphoretic.  HENT:     Head: Normocephalic and atraumatic.  Cardiovascular:     Rate and Rhythm: Normal rate and regular rhythm.     Heart sounds: No murmur heard.    No friction rub. No gallop.  Pulmonary:     Effort: Pulmonary effort is normal. No respiratory distress.     Breath sounds: Normal breath sounds. No wheezing.  Abdominal:     General: Bowel sounds are normal. There is no distension.     Palpations: Abdomen is soft.     Tenderness: There is no  abdominal tenderness.  Musculoskeletal:        General: Normal range of motion.     Cervical back: Normal range of motion and neck supple.  Skin:    General: Skin is warm and dry.  Neurological:     General: No focal deficit present.     Mental Status: She is alert and oriented to person, place, and time.     ED Results / Procedures / Treatments   Labs (all labs ordered are listed, but only abnormal results are displayed) Labs Reviewed - No data to display  EKG None  Radiology No results found.  Procedures Procedures    Medications Ordered in ED Medications - No data to display  ED Course/ Medical Decision Making/ A&P  Patient presenting here concerned that she may be spitting up blood.  She woke from sleep, then spit into the sink and noticed it was pink in color.  She also describes some cough and tightness in her chest.  Patient arrives here with stable vital signs and is afebrile.  Physical examination is unremarkable.  I did obtain a chest x-ray showing no acute process.  Patient given ibuprofen for the pain in her chest.  I feel as though she can safely be discharged.  Symptoms likely viral in nature.  She showed me a video of what she spit into the sink and it was pink in appearance.  Perhaps she had some sort of reflux from the meal she ate yesterday evening, but I doubt this was blood.  Either way, patient is clinically well-appearing and I believe can safely be discharged.  Final Clinical Impression(s) / ED Diagnoses Final diagnoses:  None    Rx / DC Orders ED Discharge Orders     None         Geoffery Lyons, MD 04/02/23 [phone]

## 2024-01-02 ENCOUNTER — Encounter (HOSPITAL_BASED_OUTPATIENT_CLINIC_OR_DEPARTMENT_OTHER): Payer: Self-pay | Admitting: Emergency Medicine

## 2024-01-02 ENCOUNTER — Other Ambulatory Visit: Payer: Self-pay

## 2024-01-02 ENCOUNTER — Emergency Department (HOSPITAL_BASED_OUTPATIENT_CLINIC_OR_DEPARTMENT_OTHER)
Admission: EM | Admit: 2024-01-02 | Discharge: 2024-01-03 | Disposition: A | Attending: Emergency Medicine | Admitting: Emergency Medicine

## 2024-01-02 DIAGNOSIS — G43909 Migraine, unspecified, not intractable, without status migrainosus: Secondary | ICD-10-CM | POA: Insufficient documentation

## 2024-01-02 DIAGNOSIS — J45909 Unspecified asthma, uncomplicated: Secondary | ICD-10-CM | POA: Insufficient documentation

## 2024-01-02 DIAGNOSIS — Z79899 Other long term (current) drug therapy: Secondary | ICD-10-CM | POA: Insufficient documentation

## 2024-01-02 MED ORDER — KETOROLAC TROMETHAMINE 30 MG/ML IJ SOLN
30.0000 mg | Freq: Once | INTRAMUSCULAR | Status: AC
  Administered 2024-01-03: 30 mg via INTRAVENOUS
  Filled 2024-01-02: qty 1

## 2024-01-02 MED ORDER — PROCHLORPERAZINE EDISYLATE 10 MG/2ML IJ SOLN
10.0000 mg | Freq: Once | INTRAMUSCULAR | Status: AC
  Administered 2024-01-03: 10 mg via INTRAVENOUS
  Filled 2024-01-02: qty 2

## 2024-01-02 NOTE — ED Provider Notes (Signed)
 [place] [facility]  Provider Note  CSN: [id number] Arrival date & time: 01/02/24 2148  History Chief Complaint  Patient presents with   Headache    Candice Robles is a 23 y.o. 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 She reports some photophobia and nausea but no vomiting or fever. She was recently given prednisone for her asthma.    Home Medications Prior to Admission medications  Medication Sig Start Date End Date Taking? Authorizing Provider  chlorhexidine (PERIDEX) 0.12 % solution SMARTSIG:By Mouth 11/21/21   [provider]  Cholecalciferol 50 MCG (2000 UT) CAPS take 1 capsule by oral route daily for 30 days    [provider]  diphenhydrAMINE (BENADRYL CHILDRENS ALLERGY) 12.5 MG/5ML liquid Take by mouth. 03/24/15   [provider]  doxycycline  (MONODOX ) 100 MG capsule Take 100 mg by mouth 2 (two) times daily. 08/07/22   [provider]  FLUoxetine (PROZAC) 40 MG capsule Take 40 mg by mouth every morning. 10/23/21   [provider]  gabapentin (NEURONTIN) 100 MG capsule TAKE 2 CAPSULES BY MOUTH EVERY MORNING AND 4 CAPSULES BY MOUTH AT NIGHT 07/27/19   [provider]  hydrOXYzine (VISTARIL) 25 MG capsule Take by mouth. 02/15/19   [provider]  ibuprofen  (ADVIL ) 600 MG tablet Take 1 tablet (600 mg total) by mouth every 6 (six) hours as needed. Patient taking differently: Take 600 mg by mouth every 6 (six) hours as needed for moderate pain. 07/12/19   [name], [name], PA-C  ondansetron  (ZOFRAN ) 4 MG tablet Take 1 tablet (4 mg total) by mouth every 6 (six) hours. 01/03/23   Doretha Folks, MD  prazosin  (MINIPRESS ) 2 MG capsule TAKE 1 CAPSULE BY MOUTH EVERY NIGHT AT BEDTIME FOR NIGHTMARES 10/14/19   [provider]  prazosin  (MINIPRESS ) 5 MG capsule Take 5  mg by mouth at bedtime. 08/24/22   [provider]  prochlorperazine  (COMPAZINE ) 10 MG tablet Take by mouth. 05/17/19   [provider]  propranolol  (INDERAL ) 60 MG tablet Take 1 tablet (60 mg total) by mouth daily. 07/16/22   Lorilee Sven SQUIBB, MD  Serdexmethylphen-Dexmethylphen (AZSTARYS ) 39.2-7.8 MG CAPS Take 1 capsule by mouth daily with breakfast. 10/02/22   Swartz, Zachary T, MD  Serdexmethylphen-Dexmethylphen 39.2-7.8 MG CAPS Take 1 capsule by mouth daily with breakfast. 10/02/22   Babs Arthea DASEN, MD  topiramate  (TOPAMAX ) 50 MG tablet TAKE 1 TABLET(50 MG) BY MOUTH TWICE DAILY 09/10/22   Babs Arthea DASEN, MD  traZODone (DESYREL) 100 MG tablet Take 100-200 mg by mouth at bedtime.    [provider]  VRAYLAR  4.5 MG CAPS SMARTSIG:1 Capsule(s) By Mouth Every Evening 06/26/22   Babs Arthea DASEN, MD     Allergies    Other   Review of Systems   Review of Systems Please see HPI for pertinent positives and negatives  Physical Exam BP (!) 155/109 (BP Location: Right Arm)   Pulse (!) 110   Temp 98.2 F (36.8 C) (Oral)   Resp 18   SpO2 99%   Physical Exam Vitals and nursing note reviewed.  Constitutional:      Appearance: Normal appearance.  HENT:     Head: Normocephalic and atraumatic.     Nose: Nose normal.     Mouth/Throat:     Mouth: Mucous membranes are moist.  Eyes:  Extraocular Movements: Extraocular movements intact.     Conjunctiva/sclera: Conjunctivae normal.  Cardiovascular:     Rate and Rhythm: Normal rate.  Pulmonary:     Effort: Pulmonary effort is normal.     Breath sounds: Normal breath sounds. No wheezing or rales.  Abdominal:     General: Abdomen is flat.     Palpations: Abdomen is soft.     Tenderness: There is no abdominal tenderness.  Musculoskeletal:        General: No swelling. Normal range of motion.     Cervical back: Neck supple.  Skin:    General: Skin is warm and dry.  Neurological:     General: No focal deficit  present.     Mental Status: She is alert and oriented to person, place, and time. Mental status is at baseline.     Cranial Nerves: No cranial nerve deficit.     Sensory: No sensory deficit.     Motor: No weakness.  Psychiatric:        Mood and Affect: Mood normal.     ED Results / Procedures / Treatments   EKG None  Procedures Procedures  Medications Ordered in the ED Medications  ketorolac  (TORADOL ) 30 MG/ML injection 30 mg (30 mg Intravenous Given 01/03/24 0003)  prochlorperazine  (COMPAZINE ) injection 10 mg (10 mg Intravenous Given 01/03/24 0003)    Initial Impression and Plan  Patient here with exacerbation of chronic headache, overall exam is reassuring, mildly elevated BP possibly due to recent steroids but no other symptoms concerning for acute ICH. Will give migraine cocktail and reassess.   ED Course   Clinical Course as of 01/03/24 0051  Sat Jan 03, 2024  0050 Patient sleeping comfortably on reassessment, headache resolved and ready to go home. Recommend PCP follow up, RTED for any other concerns.   [CS]    Clinical Course User Index [CS] Roselyn Carlin NOVAK, MD     MDM Rules/Calculators/A&P Medical Decision Making Problems Addressed: Nonintractable chronic migraine: acute illness or injury  Risk Prescription drug management.     Final Clinical Impression(s) / ED Diagnoses Final diagnoses:  Nonintractable chronic migraine    Rx / DC Orders ED Discharge Orders     None        Roselyn Carlin NOVAK, MD 01/03/24 [phone]

## 2024-01-02 NOTE — ED Triage Notes (Signed)
 Pt with severe headache to top of head on the right.  Pt has hx of brain injury.  Was began on prednisone 3 days ago by [facility] because they said her lungs were tight  She has had headache since noon today. Feels nauseated  no vomiting.  Pt states advil  usually works for her headaches but this is worse.

## 2024-01-03 NOTE — ED Notes (Signed)
 Pt has mother on phone during care.  Meds given and pt noted she was going to try and sleep.  Rails were raised x2. Lights dim. And call bell within reach. VSS

## 2024-01-03 NOTE — ED Notes (Signed)
 Spoke with mother on phone during DC, mother has been on phone with the pt through out the stay.

## 2024-02-09 ENCOUNTER — Ambulatory Visit: Admitting: Neurology
